# Patient Record
Sex: Male | Born: 1955 | Race: White | Hispanic: No | Marital: Married | Smoking: Former smoker
Health system: Southern US, Community
[De-identification: ages and names within clinical notes are randomized; demographics above are authoritative.]

## PROBLEM LIST (undated history)

## (undated) DIAGNOSIS — C801 Malignant (primary) neoplasm, unspecified: Secondary | ICD-10-CM

## (undated) DIAGNOSIS — F419 Anxiety disorder, unspecified: Secondary | ICD-10-CM

## (undated) DIAGNOSIS — M1990 Unspecified osteoarthritis, unspecified site: Secondary | ICD-10-CM

## (undated) HISTORY — PX: OTHER SURGICAL HISTORY: SHX169

## (undated) HISTORY — PX: MULTIPLE TOOTH EXTRACTIONS: SHX2053

---

## 2020-11-30 ENCOUNTER — Other Ambulatory Visit: Payer: Self-pay | Admitting: Orthopedic Surgery

## 2020-12-08 NOTE — Progress Notes (Signed)
DUE TO COVID-19 ONLY ONE VISITOR IS ALLOWED TO COME WITH YOU AND STAY IN THE WAITING ROOM ONLY DURING PRE OP AND PROCEDURE DAY OF SURGERY. THE 1 VISITOR  MAY VISIT WITH YOU AFTER SURGERY IN YOUR PRIVATE ROOM DURING VISITING HOURS ONLY!  YOU NEED TO HAVE A COVID 19 TEST ON__12/20/2021 _____ @_______ , THIS TEST MUST BE DONE BEFORE SURGERY,  COVID TESTING SITE [address] [place] [id number], IT IS ON THE RIGHT GOING OUT [address] APPROXIMATELY  2 MINUTES PAST [employer] ON THE RIGHT. ONCE YOUR COVID TEST IS COMPLETED,  PLEASE BEGIN THE QUARANTINE INSTRUCTIONS AS OUTLINED IN YOUR HANDOUT.                Raymond Huffman  12/08/2020   Your procedure is scheduled on: 12/17/2020    Report to [facility] Main  Entrance   Report to admitting at   0530am  AM     Call this number if you have problems the morning of surgery [phone]    REMEMBER: NO  SOLID FOOD CANDY OR GUM AFTER MIDNIGHT. CLEAR LIQUIDS UNTIL 0430am        . NOTHING BY MOUTH EXCEPT CLEAR LIQUIDS UNTIL    . PLEASE FINISH ENSURE DRINK PER SURGEON ORDER  WHICH NEEDS TO BE COMPLETED AT 0430am      .      CLEAR LIQUID DIET   Foods Allowed                                                                    Coffee and tea, regular and decaf                            Fruit ices (not with fruit pulp)                                      Iced Popsicles                                    Carbonated beverages, regular and diet                                    Cranberry, grape and apple juices Sports drinks like Gatorade Lightly seasoned clear broth or consume(fat free) Sugar, honey syrup ___________________________________________________________________      BRUSH YOUR TEETH MORNING OF SURGERY AND RINSE YOUR MOUTH OUT, NO CHEWING GUM CANDY OR MINTS.     Take these medicines the morning of surgery with A SIP OF WATER: Gabapentin, Inhalers as usual and bring   DO NOT TAKE ANY DIABETIC  MEDICATIONS DAY OF YOUR SURGERY                               You may not have any metal on your body including hair pins and              piercings  Do not wear jewelry, make-up, lotions, powders or perfumes, deodorant             Do not wear nail polish on your fingernails.  Do not shave  48 hours prior to surgery.              Men may shave face and neck.   Do not bring valuables to the hospital. [place].  Contacts, dentures or bridgework may not be worn into surgery.  Leave suitcase in the car. After surgery it may be brought to your room.     Patients discharged the day of surgery will not be allowed to drive home. IF YOU ARE HAVING SURGERY AND GOING HOME THE SAME DAY, YOU MUST HAVE AN ADULT TO DRIVE YOU HOME AND BE WITH YOU FOR 24 HOURS. YOU MAY GO HOME BY TAXI OR UBER OR ORTHERWISE, BUT AN ADULT MUST ACCOMPANY YOU HOME AND STAY WITH YOU FOR 24 HOURS.  Name and phone number of your driver:  Special Instructions: N/A              Please read over the following fact sheets you were given: _____________________________________________________________________  [facility] - Preparing for Surgery Before surgery, you can play an important role.  Because skin is not sterile, your skin needs to be as free of germs as possible.  You can reduce the number of germs on your skin by washing with CHG (chlorahexidine gluconate) soap before surgery.  CHG is an antiseptic cleaner which kills germs and bonds with the skin to continue killing germs even after washing. Please DO NOT use if you have an allergy to CHG or antibacterial soaps.  If your skin becomes reddened/irritated stop using the CHG and inform your nurse when you arrive at Short Stay. Do not shave (including legs and underarms) for at least 48 hours prior to the first CHG shower.  You may shave your face/neck. Please follow these instructions carefully:  1.  Shower with CHG Soap the night  before surgery and the  morning of Surgery.  2.  If you choose to wash your hair, wash your hair first as usual with your  normal  shampoo.  3.  After you shampoo, rinse your hair and body thoroughly to remove the  shampoo.                           4.  Use CHG as you would any other liquid soap.  You can apply chg directly  to the skin and wash                       Gently with a scrungie or clean washcloth.  5.  Apply the CHG Soap to your body ONLY FROM THE NECK DOWN.   Do not use on face/ open                           Wound or open sores. Avoid contact with eyes, ears mouth and genitals (private parts).                       Wash face,  Genitals (private parts) with your normal soap.             6.  Wash  thoroughly, paying special attention to the area where your surgery  will be performed.  7.  Thoroughly rinse your body with warm water from the neck down.  8.  DO NOT shower/wash with your normal soap after using and rinsing off  the CHG Soap.                9.  Pat yourself dry with a clean towel.            10.  Wear clean pajamas.            11.  Place clean sheets on your bed the night of your first shower and do not  sleep with pets. Day of Surgery : Do not apply any lotions/deodorants the morning of surgery.  Please wear clean clothes to the hospital/surgery center.  FAILURE TO FOLLOW THESE INSTRUCTIONS MAY RESULT IN THE CANCELLATION OF YOUR SURGERY PATIENT SIGNATURE_________________________________  NURSE SIGNATURE__________________________________  ________________________________________________________________________

## 2020-12-10 ENCOUNTER — Ambulatory Visit (HOSPITAL_COMMUNITY)
Admission: RE | Admit: 2020-12-10 | Discharge: 2020-12-10 | Disposition: A | Discharge status: Home / Self Care | Payer: PRIVATE HEALTH INSURANCE | Source: Ambulatory Visit | Attending: Orthopedic Surgery | Admitting: Orthopedic Surgery

## 2020-12-10 ENCOUNTER — Encounter (INDEPENDENT_AMBULATORY_CARE_PROVIDER_SITE_OTHER): Payer: Self-pay

## 2020-12-10 ENCOUNTER — Encounter (HOSPITAL_COMMUNITY)
Admission: RE | Admit: 2020-12-10 | Discharge: 2020-12-10 | Disposition: A | Discharge status: Home / Self Care | Payer: PRIVATE HEALTH INSURANCE | Source: Ambulatory Visit | Attending: Orthopedic Surgery | Admitting: Orthopedic Surgery

## 2020-12-10 ENCOUNTER — Other Ambulatory Visit: Payer: Self-pay

## 2020-12-10 ENCOUNTER — Encounter (HOSPITAL_COMMUNITY): Payer: Self-pay

## 2020-12-10 DIAGNOSIS — Z01811 Encounter for preprocedural respiratory examination: Secondary | ICD-10-CM

## 2020-12-10 HISTORY — DX: Unspecified osteoarthritis, unspecified site: M19.90

## 2020-12-10 HISTORY — DX: Malignant (primary) neoplasm, unspecified: C80.1

## 2020-12-10 HISTORY — DX: Anxiety disorder, unspecified: F41.9

## 2020-12-10 LAB — COMPREHENSIVE METABOLIC PANEL
ALT: 16 U/L (ref 0–44)
AST: 18 U/L (ref 15–41)
Albumin: 4.1 g/dL (ref 3.5–5.0)
Alkaline Phosphatase: 65 U/L (ref 38–126)
Anion gap: 10 (ref 5–15)
BUN: 19 mg/dL (ref 8–23)
CO2: 28 mmol/L (ref 22–32)
Calcium: 9.2 mg/dL (ref 8.9–10.3)
Chloride: 102 mmol/L (ref 98–111)
Creatinine, Ser: 0.78 mg/dL (ref 0.61–1.24)
GFR, Estimated: >60 mL/min (ref >60)
Glucose, Bld: 98 mg/dL (ref 70–99)
Potassium: 3.6 mmol/L (ref 3.5–5.1)
Sodium: 140 mmol/L (ref 135–145)
Total Bilirubin: 0.2 mg/dL — ABNORMAL LOW (ref 0.3–1.2)
Total Protein: 6.7 g/dL (ref 6.5–8.1)

## 2020-12-10 LAB — URINALYSIS, ROUTINE W REFLEX MICROSCOPIC
Bilirubin Urine: NEGATIVE
Glucose, UA: NEGATIVE mg/dL
Hgb urine dipstick: NEGATIVE
Ketones, ur: NEGATIVE mg/dL
Leukocytes,Ua: NEGATIVE
Nitrite: NEGATIVE
Protein, ur: NEGATIVE mg/dL
Specific Gravity, Urine: 1.029 (ref 1.005–1.030)
pH: 5 (ref 5.0–8.0)

## 2020-12-10 LAB — CBC WITH DIFFERENTIAL/PLATELET
Abs Immature Granulocytes: 0.01 10*3/uL (ref 0.00–0.07)
Basophils Absolute: 0 10*3/uL (ref 0.0–0.1)
Basophils Relative: 1 %
Eosinophils Absolute: 0.2 10*3/uL (ref 0.0–0.5)
Eosinophils Relative: 6 %
HCT: 33.6 % — ABNORMAL LOW (ref 39.0–52.0)
Hemoglobin: 10.7 g/dL — ABNORMAL LOW (ref 13.0–17.0)
Immature Granulocytes: 0 %
Lymphocytes Relative: 23 %
Lymphs Abs: 0.7 10*3/uL (ref 0.7–4.0)
MCH: 29.9 pg (ref 26.0–34.0)
MCHC: 31.8 g/dL (ref 30.0–36.0)
MCV: 93.9 fL (ref 80.0–100.0)
Monocytes Absolute: 0.6 10*3/uL (ref 0.1–1.0)
Monocytes Relative: 21 %
Neutro Abs: 1.6 10*3/uL — ABNORMAL LOW (ref 1.7–7.7)
Neutrophils Relative %: 49 %
Platelets: 200 10*3/uL (ref 150–400)
RBC: 3.58 MIL/uL — ABNORMAL LOW (ref 4.22–5.81)
RDW: 14.1 % (ref 11.5–15.5)
WBC: 3.1 10*3/uL — ABNORMAL LOW (ref 4.0–10.5)
nRBC: 0 % (ref 0.0–0.2)

## 2020-12-10 LAB — PROTIME-INR
INR: 1 (ref 0.8–1.2)
Prothrombin Time: 12.9 seconds (ref 11.4–15.2)

## 2020-12-10 LAB — APTT: aPTT: 35 seconds (ref 24–36)

## 2020-12-10 LAB — SURGICAL PCR SCREEN
MRSA, PCR: POSITIVE — AB
Staphylococcus aureus: POSITIVE — AB

## 2020-12-10 NOTE — Progress Notes (Signed)
[name]- Preparing for Total Shoulder Arthroplasty    Before surgery, you can play an important role. Because skin is not sterile, your skin needs to be as free of germs as possible. You can reduce the number of germs on your skin by using the following products. . Benzoyl Peroxide Gel o Reduces the number of germs present on the skin o Applied twice a day to shoulder area starting two days before surgery    ==================================================================  Please follow these instructions carefully:  BENZOYL PEROXIDE 5% GEL  Please do not use if you have an allergy to benzoyl peroxide.   If your skin becomes reddened/irritated stop using the benzoyl peroxide.  Starting two days before surgery, apply as follows: 1. Apply benzoyl peroxide in the morning and at night. Apply after taking a shower. If you are not taking a shower clean entire shoulder front, back, and side along with the armpit with a clean wet washcloth.  2. Place a quarter-sized dollop on your shoulder and rub in thoroughly, making sure to cover the front, back, and side of your shoulder, along with the armpit.   2 days before ____ AM   ____ PM              1 day before ____ AM   ____ PM                         3. Do this twice a day for two days.  (Last application is the night before surgery, AFTER using the CHG soap as described below).  4. Do NOT apply benzoyl peroxide gel on the day of surgery.

## 2020-12-10 NOTE — Progress Notes (Signed)
CBCdone 12/10/2020 routed to Dr Tamera Punt.

## 2020-12-11 ENCOUNTER — Other Ambulatory Visit: Payer: Self-pay | Admitting: Orthopedic Surgery

## 2020-12-14 ENCOUNTER — Other Ambulatory Visit (HOSPITAL_COMMUNITY)
Admission: RE | Admit: 2020-12-14 | Discharge: 2020-12-14 | Disposition: A | Discharge status: Home / Self Care | Payer: PRIVATE HEALTH INSURANCE | Source: Ambulatory Visit | Attending: Orthopedic Surgery | Admitting: Orthopedic Surgery

## 2020-12-14 DIAGNOSIS — Z20822 Contact with and (suspected) exposure to covid-19: Secondary | ICD-10-CM | POA: Insufficient documentation

## 2020-12-14 DIAGNOSIS — Z01812 Encounter for preprocedural laboratory examination: Secondary | ICD-10-CM | POA: Insufficient documentation

## 2020-12-15 LAB — SARS CORONAVIRUS 2 (TAT 6-24 HRS): SARS Coronavirus 2: NEGATIVE

## 2020-12-16 ENCOUNTER — Encounter (HOSPITAL_COMMUNITY): Payer: Self-pay | Admitting: Orthopedic Surgery

## 2020-12-16 MED ORDER — VANCOMYCIN HCL 1500 MG/300ML IV SOLN
1500.0000 mg | Freq: Once | INTRAVENOUS | Status: AC
  Administered 2020-12-17: 07:00:00 1500 mg via INTRAVENOUS
  Filled 2020-12-16: qty 300

## 2020-12-16 NOTE — Anesthesia Preprocedure Evaluation (Addendum)
Anesthesia Evaluation  Patient identified by MRN, date of birth, ID band Patient awake    Reviewed: Allergy & Precautions, NPO status , Patient's Chart, lab work & pertinent test results  Airway Mallampati: I  TM Distance: >3 FB Neck ROM: Full    Dental  (+) Teeth Intact, Dental Advisory Given, Missing, Caps, Loose,    Pulmonary neg pulmonary ROS, former smoker,    Pulmonary exam normal breath sounds clear to auscultation       Cardiovascular negative cardio ROS Normal cardiovascular exam Rhythm:Regular Rate:Normal  EKG- NSR, normal   Neuro/Psych Anxiety negative neurological ROS     GI/Hepatic Neg liver ROS, Hx/o tonsillar Ca   Endo/Other  negative endocrine ROS  Renal/GU negative Renal ROS  negative genitourinary   Musculoskeletal  (+) Arthritis , Osteoarthritis,  Right shoulder rotator cuff tear   Abdominal   Peds  Hematology  (+) anemia ,   Anesthesia Other Findings   Reproductive/Obstetrics                           Anesthesia Physical Anesthesia Plan  ASA: II  Anesthesia Plan: General   Post-op Pain Management:  Regional for Post-op pain   Induction: Intravenous  PONV Risk Score and Plan: 3 and Midazolam, Ondansetron and Treatment may vary due to age or medical condition  Airway Management Planned: Oral ETT  Additional Equipment:   Intra-op Plan:   Post-operative Plan: Extubation in OR  Informed Consent: I have reviewed the patients History and Physical, chart, labs and discussed the procedure including the risks, benefits and alternatives for the proposed anesthesia with the patient or authorized representative who has indicated his/her understanding and acceptance.     Dental advisory given  Plan Discussed with: CRNA and Anesthesiologist  Anesthesia Plan Comments:        Anesthesia Quick Evaluation

## 2020-12-17 ENCOUNTER — Other Ambulatory Visit: Payer: Self-pay

## 2020-12-17 ENCOUNTER — Observation Stay (HOSPITAL_COMMUNITY)
Admission: RE | Admit: 2020-12-17 | Discharge: 2020-12-18 | Disposition: A | Discharge status: Home / Self Care | Payer: Worker's Compensation | Source: Ambulatory Visit | Attending: Orthopedic Surgery | Admitting: Orthopedic Surgery

## 2020-12-17 ENCOUNTER — Observation Stay (HOSPITAL_COMMUNITY): Payer: Worker's Compensation

## 2020-12-17 ENCOUNTER — Ambulatory Visit (HOSPITAL_COMMUNITY): Payer: Worker's Compensation | Admitting: Anesthesiology

## 2020-12-17 ENCOUNTER — Encounter (HOSPITAL_COMMUNITY): Payer: Self-pay | Admitting: Orthopedic Surgery

## 2020-12-17 ENCOUNTER — Encounter (HOSPITAL_COMMUNITY)
Admission: RE | Disposition: A | Discharge status: Home / Self Care | Payer: Self-pay | Source: Ambulatory Visit | Attending: Orthopedic Surgery

## 2020-12-17 DIAGNOSIS — M75101 Unspecified rotator cuff tear or rupture of right shoulder, not specified as traumatic: Secondary | ICD-10-CM | POA: Diagnosis not present

## 2020-12-17 DIAGNOSIS — Z87891 Personal history of nicotine dependence: Secondary | ICD-10-CM | POA: Insufficient documentation

## 2020-12-17 DIAGNOSIS — Z96611 Presence of right artificial shoulder joint: Secondary | ICD-10-CM

## 2020-12-17 DIAGNOSIS — M25511 Pain in right shoulder: Secondary | ICD-10-CM | POA: Diagnosis present

## 2020-12-17 DIAGNOSIS — Z85818 Personal history of malignant neoplasm of other sites of lip, oral cavity, and pharynx: Secondary | ICD-10-CM | POA: Insufficient documentation

## 2020-12-17 DIAGNOSIS — G8929 Other chronic pain: Secondary | ICD-10-CM | POA: Diagnosis not present

## 2020-12-17 HISTORY — PX: REVERSE SHOULDER ARTHROPLASTY: SHX5054

## 2020-12-17 LAB — TYPE AND SCREEN
ABO/RH(D): O POS
Antibody Screen: NEGATIVE

## 2020-12-17 LAB — ABO/RH: ABO/RH(D): O POS

## 2020-12-17 SURGERY — ARTHROPLASTY, SHOULDER, TOTAL, REVERSE
Anesthesia: General | Site: Shoulder | Laterality: Right

## 2020-12-17 MED ORDER — SUGAMMADEX SODIUM 500 MG/5ML IV SOLN
INTRAVENOUS | Status: AC
  Filled 2020-12-17: qty 5

## 2020-12-17 MED ORDER — PROPOFOL 10 MG/ML IV BOLUS
INTRAVENOUS | Status: DC | PRN
  Administered 2020-12-17: 200 mg via INTRAVENOUS

## 2020-12-17 MED ORDER — OXYCODONE HCL 5 MG PO TABS
ORAL_TABLET | ORAL | Status: AC
  Filled 2020-12-17: qty 2

## 2020-12-17 MED ORDER — METOCLOPRAMIDE HCL 5 MG/ML IJ SOLN: 5.0000 mg | Freq: Three times a day (TID) | INTRAMUSCULAR | Status: DC | PRN

## 2020-12-17 MED ORDER — POLYETHYLENE GLYCOL 3350 17 G PO PACK: 17.0000 g | PACK | Freq: Every day | ORAL | Status: DC | PRN

## 2020-12-17 MED ORDER — TRANEXAMIC ACID-NACL 1000-0.7 MG/100ML-% IV SOLN
1000.0000 mg | INTRAVENOUS | Status: AC
  Administered 2020-12-17: 08:00:00 1000 mg via INTRAVENOUS
  Filled 2020-12-17: qty 100

## 2020-12-17 MED ORDER — ONDANSETRON HCL 4 MG/2ML IJ SOLN: 4.0000 mg | Freq: Once | INTRAMUSCULAR | Status: DC | PRN

## 2020-12-17 MED ORDER — OXYCODONE HCL 5 MG PO TABS: 5.0000 mg | ORAL_TABLET | ORAL | Status: DC | PRN

## 2020-12-17 MED ORDER — SODIUM CHLORIDE 0.9 % IV SOLN: INTRAVENOUS | Status: DC

## 2020-12-17 MED ORDER — HYDROMORPHONE HCL 1 MG/ML IJ SOLN: 0.5000 mg | INTRAMUSCULAR | Status: DC | PRN

## 2020-12-17 MED ORDER — MIDAZOLAM HCL 2 MG/2ML IJ SOLN
INTRAMUSCULAR | Status: AC
  Filled 2020-12-17: qty 2

## 2020-12-17 MED ORDER — CEFAZOLIN SODIUM-DEXTROSE 2-4 GM/100ML-% IV SOLN
2.0000 g | Freq: Once | INTRAVENOUS | Status: AC
  Administered 2020-12-17: 08:00:00 2 g via INTRAVENOUS
  Filled 2020-12-17: qty 100

## 2020-12-17 MED ORDER — SODIUM CHLORIDE 0.9 % IR SOLN
Status: DC | PRN
  Administered 2020-12-17: 1000 mL

## 2020-12-17 MED ORDER — CEFAZOLIN SODIUM-DEXTROSE 2-4 GM/100ML-% IV SOLN: 2.0000 g | INTRAVENOUS | Status: DC

## 2020-12-17 MED ORDER — ASPIRIN EC 81 MG PO TBEC
81.0000 mg | DELAYED_RELEASE_TABLET | Freq: Two times a day (BID) | ORAL | Status: DC
  Administered 2020-12-17 – 2020-12-18 (×2): 81 mg via ORAL
  Filled 2020-12-17 (×2): qty 1

## 2020-12-17 MED ORDER — ACETAMINOPHEN 500 MG PO TABS
1000.0000 mg | ORAL_TABLET | Freq: Four times a day (QID) | ORAL | Status: AC
  Administered 2020-12-17 – 2020-12-18 (×4): 1000 mg via ORAL
  Filled 2020-12-17 (×4): qty 2

## 2020-12-17 MED ORDER — MENTHOL 3 MG MT LOZG: 1.0000 | LOZENGE | OROMUCOSAL | Status: DC | PRN

## 2020-12-17 MED ORDER — FENTANYL CITRATE (PF) 100 MCG/2ML IJ SOLN
INTRAMUSCULAR | Status: AC
  Administered 2020-12-17: 09:00:00 50 ug via INTRAVENOUS
  Filled 2020-12-17: qty 2

## 2020-12-17 MED ORDER — WATER FOR IRRIGATION, STERILE IR SOLN
Status: DC | PRN
  Administered 2020-12-17: 2000 mL

## 2020-12-17 MED ORDER — LACTATED RINGERS IV SOLN: INTRAVENOUS | Status: DC | PRN

## 2020-12-17 MED ORDER — FENTANYL CITRATE (PF) 250 MCG/5ML IJ SOLN
INTRAMUSCULAR | Status: DC | PRN
  Administered 2020-12-17 (×2): 50 ug via INTRAVENOUS

## 2020-12-17 MED ORDER — PHENYLEPHRINE HCL-NACL 10-0.9 MG/250ML-% IV SOLN
INTRAVENOUS | Status: AC
  Filled 2020-12-17: qty 250

## 2020-12-17 MED ORDER — DIPHENHYDRAMINE HCL 12.5 MG/5ML PO ELIX
12.5000 mg | ORAL_SOLUTION | ORAL | Status: DC | PRN
  Administered 2020-12-17: 25 mg via ORAL
  Filled 2020-12-17: qty 10

## 2020-12-17 MED ORDER — METHOCARBAMOL 500 MG IVPB - SIMPLE MED
INTRAVENOUS | Status: AC
  Filled 2020-12-17: qty 50

## 2020-12-17 MED ORDER — PHENYLEPHRINE HCL-NACL 10-0.9 MG/250ML-% IV SOLN
INTRAVENOUS | Status: DC | PRN
  Administered 2020-12-17: 25 ug/min via INTRAVENOUS

## 2020-12-17 MED ORDER — FENTANYL CITRATE (PF) 100 MCG/2ML IJ SOLN
25.0000 ug | INTRAMUSCULAR | Status: DC | PRN
Start: 2020-12-17 — End: 2020-12-17
  Administered 2020-12-17: 50 ug via INTRAVENOUS

## 2020-12-17 MED ORDER — ONDANSETRON HCL 4 MG/2ML IJ SOLN: 4.0000 mg | Freq: Four times a day (QID) | INTRAMUSCULAR | Status: DC | PRN

## 2020-12-17 MED ORDER — LIDOCAINE HCL (PF) 2 % IJ SOLN
INTRAMUSCULAR | Status: AC
  Filled 2020-12-17: qty 5

## 2020-12-17 MED ORDER — DOCUSATE SODIUM 100 MG PO CAPS
100.0000 mg | ORAL_CAPSULE | Freq: Two times a day (BID) | ORAL | Status: DC
  Administered 2020-12-17 – 2020-12-18 (×2): 100 mg via ORAL
  Filled 2020-12-17 (×2): qty 1

## 2020-12-17 MED ORDER — OXYCODONE HCL 5 MG PO TABS
10.0000 mg | ORAL_TABLET | ORAL | Status: DC | PRN
  Administered 2020-12-17: 10 mg via ORAL
  Administered 2020-12-17 – 2020-12-18 (×4): 15 mg via ORAL
  Filled 2020-12-17 (×4): qty 3

## 2020-12-17 MED ORDER — ONDANSETRON HCL 4 MG/2ML IJ SOLN
INTRAMUSCULAR | Status: AC
  Filled 2020-12-17: qty 2

## 2020-12-17 MED ORDER — SUGAMMADEX SODIUM 500 MG/5ML IV SOLN
INTRAVENOUS | Status: DC | PRN
  Administered 2020-12-17: 400 mg via INTRAVENOUS

## 2020-12-17 MED ORDER — BUPIVACAINE-EPINEPHRINE (PF) 0.5% -1:200000 IJ SOLN
INTRAMUSCULAR | Status: DC | PRN
  Administered 2020-12-17: 20 mL via PERINEURAL

## 2020-12-17 MED ORDER — PHENOL 1.4 % MT LIQD: 1.0000 | OROMUCOSAL | Status: DC | PRN

## 2020-12-17 MED ORDER — ALUM & MAG HYDROXIDE-SIMETH 200-200-20 MG/5ML PO SUSP: 30.0000 mL | ORAL | Status: DC | PRN

## 2020-12-17 MED ORDER — ONDANSETRON HCL 4 MG PO TABS: 4.0000 mg | ORAL_TABLET | Freq: Four times a day (QID) | ORAL | Status: DC | PRN

## 2020-12-17 MED ORDER — GABAPENTIN 300 MG PO CAPS
300.0000 mg | ORAL_CAPSULE | Freq: Four times a day (QID) | ORAL | Status: DC
  Administered 2020-12-17 – 2020-12-18 (×3): 300 mg via ORAL
  Filled 2020-12-17 (×4): qty 1

## 2020-12-17 MED ORDER — ROCURONIUM BROMIDE 10 MG/ML (PF) SYRINGE
PREFILLED_SYRINGE | INTRAVENOUS | Status: DC | PRN
  Administered 2020-12-17: 30 mg via INTRAVENOUS
  Administered 2020-12-17: 10 mg via INTRAVENOUS
  Administered 2020-12-17: 60 mg via INTRAVENOUS

## 2020-12-17 MED ORDER — ONDANSETRON HCL 4 MG/2ML IJ SOLN
INTRAMUSCULAR | Status: DC | PRN
  Administered 2020-12-17: 4 mg via INTRAVENOUS

## 2020-12-17 MED ORDER — LIDOCAINE 2% (20 MG/ML) 5 ML SYRINGE
INTRAMUSCULAR | Status: DC | PRN
  Administered 2020-12-17: 60 mg via INTRAVENOUS

## 2020-12-17 MED ORDER — PROPOFOL 10 MG/ML IV BOLUS
INTRAVENOUS | Status: AC
  Filled 2020-12-17: qty 20

## 2020-12-17 MED ORDER — ROCURONIUM BROMIDE 10 MG/ML (PF) SYRINGE
PREFILLED_SYRINGE | INTRAVENOUS | Status: AC
  Filled 2020-12-17: qty 10

## 2020-12-17 MED ORDER — ZOLPIDEM TARTRATE 5 MG PO TABS: 5.0000 mg | ORAL_TABLET | Freq: Every evening | ORAL | Status: DC | PRN

## 2020-12-17 MED ORDER — METHOCARBAMOL 500 MG PO TABS
500.0000 mg | ORAL_TABLET | Freq: Four times a day (QID) | ORAL | Status: DC | PRN
  Administered 2020-12-17 – 2020-12-18 (×2): 500 mg via ORAL
  Filled 2020-12-17 (×2): qty 1

## 2020-12-17 MED ORDER — MIDAZOLAM HCL 5 MG/5ML IJ SOLN
INTRAMUSCULAR | Status: DC | PRN
  Administered 2020-12-17 (×2): 1 mg via INTRAVENOUS

## 2020-12-17 MED ORDER — METOCLOPRAMIDE HCL 5 MG PO TABS: 5.0000 mg | ORAL_TABLET | Freq: Three times a day (TID) | ORAL | Status: DC | PRN

## 2020-12-17 MED ORDER — BUPIVACAINE LIPOSOME 1.3 % IJ SUSP
INTRAMUSCULAR | Status: DC | PRN
  Administered 2020-12-17: 10 mL via PERINEURAL

## 2020-12-17 MED ORDER — METHOCARBAMOL 500 MG IVPB - SIMPLE MED
500.0000 mg | Freq: Four times a day (QID) | INTRAVENOUS | Status: DC | PRN
  Administered 2020-12-17: 10:00:00 500 mg via INTRAVENOUS
  Filled 2020-12-17: qty 50

## 2020-12-17 MED ORDER — CEFAZOLIN SODIUM-DEXTROSE 2-4 GM/100ML-% IV SOLN
2.0000 g | Freq: Four times a day (QID) | INTRAVENOUS | Status: AC
  Administered 2020-12-17 – 2020-12-18 (×3): 2 g via INTRAVENOUS
  Filled 2020-12-17 (×3): qty 100

## 2020-12-17 MED ORDER — 0.9 % SODIUM CHLORIDE (POUR BTL) OPTIME
TOPICAL | Status: DC | PRN
  Administered 2020-12-17: 08:00:00 1000 mL

## 2020-12-17 MED ORDER — FLEET ENEMA 7-19 GM/118ML RE ENEM: 1.0000 | ENEMA | Freq: Once | RECTAL | Status: DC | PRN

## 2020-12-17 MED ORDER — FENTANYL CITRATE (PF) 100 MCG/2ML IJ SOLN
INTRAMUSCULAR | Status: AC
  Filled 2020-12-17: qty 2

## 2020-12-17 MED ORDER — DEXAMETHASONE SODIUM PHOSPHATE 10 MG/ML IJ SOLN
INTRAMUSCULAR | Status: AC
  Filled 2020-12-17: qty 1

## 2020-12-17 MED ORDER — BISACODYL 5 MG PO TBEC: 5.0000 mg | DELAYED_RELEASE_TABLET | Freq: Every day | ORAL | Status: DC | PRN

## 2020-12-17 SURGICAL SUPPLY — 79 items
BAG ZIPLOCK 12X15 (MISCELLANEOUS) ×3 IMPLANT
BASEPLATE P2 COATD GLND 6.5X30 (Shoulder) ×1 IMPLANT
BIT DRILL 1.6MX128 (BIT) IMPLANT
BIT DRILL 1.6MX128MM (BIT)
BIT DRILL 2.5 DIA 127 CALI (BIT) ×3 IMPLANT
BIT DRILL 4 DIA CALIBRATED (BIT) ×3 IMPLANT
BLADE SAW SAG 73X25 THK (BLADE) ×2
BLADE SAW SGTL 73X25 THK (BLADE) ×1 IMPLANT
BOOTIES KNEE HIGH SLOAN (MISCELLANEOUS) ×6 IMPLANT
CLOSURE WOUND 1/2 X4 (GAUZE/BANDAGES/DRESSINGS) ×1
COOLER ICEMAN CLASSIC (MISCELLANEOUS) IMPLANT
COVER BACK TABLE 60X90IN (DRAPES) ×3 IMPLANT
COVER SURGICAL LIGHT HANDLE (MISCELLANEOUS) ×3 IMPLANT
COVER WAND RF STERILE (DRAPES) IMPLANT
DRAPE INCISE IOBAN 66X45 STRL (DRAPES) ×3 IMPLANT
DRAPE ORTHO SPLIT 77X108 STRL (DRAPES) ×4
DRAPE POUCH INSTRU U-SHP 10X18 (DRAPES) ×3 IMPLANT
DRAPE SHEET LG 3/4 BI-LAMINATE (DRAPES) ×3 IMPLANT
DRAPE SURG 17X11 SM STRL (DRAPES) ×3 IMPLANT
DRAPE SURG ORHT 6 SPLT 77X108 (DRAPES) ×2 IMPLANT
DRAPE TOP 10253 STERILE (DRAPES) ×3 IMPLANT
DRAPE U-SHAPE 47X51 STRL (DRAPES) ×3 IMPLANT
DRESSING AQUACEL AG SP 3.5X10 (GAUZE/BANDAGES/DRESSINGS) ×1 IMPLANT
DRSG AQUACEL AG ADV 3.5X 6 (GAUZE/BANDAGES/DRESSINGS) ×3 IMPLANT
DRSG AQUACEL AG SP 3.5X10 (GAUZE/BANDAGES/DRESSINGS) ×3
DURAPREP 26ML APPLICATOR (WOUND CARE) ×6 IMPLANT
ELECT BLADE TIP CTD 4 INCH (ELECTRODE) ×3 IMPLANT
ELECT REM PT RETURN 15FT ADLT (MISCELLANEOUS) ×3 IMPLANT
GLOVE BIO SURGEON STRL SZ7.5 (GLOVE) ×3 IMPLANT
GLOVE BIOGEL PI IND STRL 7.0 (GLOVE) ×1 IMPLANT
GLOVE BIOGEL PI IND STRL 8 (GLOVE) ×1 IMPLANT
GLOVE BIOGEL PI INDICATOR 7.0 (GLOVE) ×2
GLOVE BIOGEL PI INDICATOR 8 (GLOVE) ×2
GLOVE SURG ENC MOIS LTX SZ7 (GLOVE) ×3 IMPLANT
GOWN STRL REUS W/TWL LRG LVL3 (GOWN DISPOSABLE) ×3 IMPLANT
GOWN STRL REUS W/TWL XL LVL3 (GOWN DISPOSABLE) ×3 IMPLANT
HANDPIECE INTERPULSE COAX TIP (DISPOSABLE) ×2
HOOD PEEL AWAY FLYTE STAYCOOL (MISCELLANEOUS) ×9 IMPLANT
INSERT EPOLY STND HUMERUS 32MM (Shoulder) ×3 IMPLANT
INSERT EPOLYSTD HUMERUS 32MM (Shoulder) ×1 IMPLANT
KIT BASIN OR (CUSTOM PROCEDURE TRAY) ×3 IMPLANT
KIT TURNOVER KIT A (KITS) ×3 IMPLANT
MANIFOLD NEPTUNE II (INSTRUMENTS) ×3 IMPLANT
NEEDLE TROCAR POINT SZ 2 1/2 (NEEDLE) IMPLANT
NS IRRIG 1000ML POUR BTL (IV SOLUTION) ×3 IMPLANT
P2 COATDE GLNOID BSEPLT 6.5X30 (Shoulder) ×3 IMPLANT
PACK SHOULDER (CUSTOM PROCEDURE TRAY) ×3 IMPLANT
PAD COLD SHLDR WRAP-ON (PAD) IMPLANT
PROTECTOR NERVE ULNAR (MISCELLANEOUS) IMPLANT
RESTRAINT HEAD UNIVERSAL NS (MISCELLANEOUS) IMPLANT
RETRIEVER SUT HEWSON (MISCELLANEOUS) IMPLANT
SCREW BONE LOCKING RSP 5.0X14 (Screw) ×3 IMPLANT
SCREW BONE LOCKING RSP 5.0X30 (Screw) ×6 IMPLANT
SCREW BONE RSP LOCK 5X14 (Screw) ×1 IMPLANT
SCREW BONE RSP LOCK 5X18 (Screw) ×1 IMPLANT
SCREW BONE RSP LOCK 5X30 (Screw) ×2 IMPLANT
SCREW BONE RSP LOCKING 18MM LG (Screw) ×3 IMPLANT
SCREW RETAIN W/HEAD 32MM (Shoulder) ×3 IMPLANT
SET HNDPC FAN SPRY TIP SCT (DISPOSABLE) ×1 IMPLANT
SLING ARM FOAM STRAP LRG (SOFTGOODS) ×3 IMPLANT
SLING ARM IMMOBILIZER LRG (SOFTGOODS) ×3 IMPLANT
SLING ARM IMMOBILIZER MED (SOFTGOODS) IMPLANT
SPONGE LAP 18X18 RF (DISPOSABLE) ×3 IMPLANT
STEM HUMERAL 12X48 STD SHORT (Shoulder) ×3 IMPLANT
STRIP CLOSURE SKIN 1/2X4 (GAUZE/BANDAGES/DRESSINGS) ×2 IMPLANT
SUCTION FRAZIER HANDLE 10FR (MISCELLANEOUS)
SUCTION TUBE FRAZIER 10FR DISP (MISCELLANEOUS) IMPLANT
SUPPORT WRAP ARM LG (MISCELLANEOUS) IMPLANT
SUT ETHIBOND 2 V 37 (SUTURE) ×3 IMPLANT
SUT FIBERWIRE #2 38 REV NDL BL (SUTURE)
SUT MNCRL AB 4-0 PS2 18 (SUTURE) ×3 IMPLANT
SUT VIC AB 2-0 CT1 27 (SUTURE) ×2
SUT VIC AB 2-0 CT1 TAPERPNT 27 (SUTURE) ×1 IMPLANT
SUTURE FIBERWR#2 38 REV NDL BL (SUTURE) IMPLANT
TAPE LABRALWHITE 1.5X36 (TAPE) IMPLANT
TAPE SUT LABRALTAP WHT/BLK (SUTURE) IMPLANT
TOWEL OR 17X26 10 PK STRL BLUE (TOWEL DISPOSABLE) ×3 IMPLANT
TOWEL OR NON WOVEN STRL DISP B (DISPOSABLE) ×3 IMPLANT
WATER STERILE IRR 1000ML POUR (IV SOLUTION) ×3 IMPLANT

## 2020-12-17 NOTE — Anesthesia Procedure Notes (Signed)
Anesthesia Regional Block: Interscalene brachial plexus block   Pre-Anesthetic Checklist: ,, timeout performed, Correct Patient, Correct Site, Correct Laterality, Correct Procedure, Correct Position, site marked, Risks and benefits discussed,  Surgical consent,  Pre-op evaluation,  At surgeon's request and post-op pain management  Laterality: Right  Prep: chloraprep       Needles:  Injection technique: Single-shot  Needle Type: Echogenic Stimulator Needle     Needle Length: 9cm  Needle Gauge: 21   Needle insertion depth: 6 cm   Additional Needles:   Procedures:,,,, ultrasound used (permanent image in chart),,,,  Motor weakness within 7 minutes.  Narrative:  Start time: 12/17/2020 6:50 AM End time: 12/17/2020 6:55 AM Injection made incrementally with aspirations every 5 mL.  Performed by: Personally   Additional Notes: Timeout performed. Patient sedated. Relevant anatomy ID'd using [place]. Incremental 2-36ml injection of LA with frequent aspiration. Patient tolerated procedure well.        Right Interscalene Block

## 2020-12-17 NOTE — Discharge Instructions (Signed)
Dental Antibiotics:  In most cases prophylactic antibiotics for Dental procdeures after total joint surgery are not necessary.  Exceptions are as follows:  1. History of prior total joint infection  2. Severely immunocompromised (Organ Transplant, cancer chemotherapy, Rheumatoid biologic meds such as Humera)  3. Poorly controlled diabetes (A1C &gt; 8.0, blood glucose over 200)  If you have one of these conditions, contact your surgeon for an antibiotic prescription, prior to your dental procedure.  Discharge Instructions after Reverse Total Shoulder Arthroplasty   . A sling has been provided for you. You are to wear this at all times (except for bathing and dressing), until your first post operative visit with Dr. Chandler. Please also wear while sleeping at night. While you bath and dress, let the arm/elbow extend straight down to stretch your elbow. Wiggle your fingers and pump your first while your in the sling to prevent hand swelling. . Use ice on the shoulder intermittently over the first 48 hours after surgery. Continue to use ice or and ice machine as needed after 48 hours for pain control/swelling.  . Pain medicine has been prescribed for you.  . Use your medicine liberally over the first 48 hours, and then you can begin to taper your use. You may take Extra Strength Tylenol or Tylenol only in place of the pain pills. DO NOT take ANY nonsteroidal anti-inflammatory pain medications: Advil, Motrin, Ibuprofen, Aleve, Naproxen or Naprosyn.  . Take one aspirin a day for 2 weeks after surgery, unless you have an aspirin sensitivity/allergy or asthma.  . Leave your dressing on until your first follow up visit.  You may shower with the dressing.  Hold your arm as if you still have your sling on while you shower. . Simply allow the water to wash over the site and then pat dry. Make sure your axilla (armpit) is completely dry after showering.    Please call [phone] during normal  business hours or [phone] after hours for any problems. Including the following:  - excessive redness of the incisions - drainage for more than 4 days - fever of more than 101.5 F  *Please note that pain medications will not be refilled after hours or on weekends.      

## 2020-12-17 NOTE — Op Note (Signed)
Procedure(s): REVERSE SHOULDER ARTHROPLASTY Procedure Note  Curren Mohrmann male 64 y.o. 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 male  With recurrent right shoulder rotator cuff tear.  Pain and dysfunction interfered with quality of life and nonoperative treatment with activity modification, NSAIDS and injections failed.     Surgeon: Isabella Stalling   Assistants: Jeanmarie Hubert PA-C [facility] was present and scrubbed throughout the procedure and was essential in positioning, retraction, exposure, and closure)    Procedure Detail  REVERSE SHOULDER ARTHROPLASTY   Estimated Blood Loss:  200 mL         Drains: none  Blood Given: none          Specimens: none        Complications:  * No complications entered in OR log *         Disposition: PACU - hemodynamically stable.         Condition: stable      OPERATIVE FINDINGS:  A DJO Altivate pressfit reverse total shoulder arthroplasty was placed with a  size 12 stem, a 32 standard glenosphere, and a standard-mm poly insert. The base plate  fixation was excellent.  PROCEDURE: The patient was identified in the preoperative holding area  where I personally marked the operative site after verifying site, side,  and procedure with the patient. An interscalene block given by  the attending anesthesiologist in the holding area and the patient was taken back to the operating room where all extremities were  carefully padded in position after general anesthesia was induced. She  was placed in a beach-chair position and the operative upper extremity was  prepped and draped in a standard sterile fashion. An approximately 10-  cm incision was made from the tip of the coracoid process to the center  point of the  humerus at the level of the axilla. Dissection was carried  down through subcutaneous tissues to the level of the cephalic vein  which was taken laterally with the deltoid. The pectoralis major was  retracted medially. The subdeltoid space was developed and the lateral  edge of the conjoined tendon was identified. The undersurface of  conjoined tendon was palpated and the musculocutaneous nerve was not in  the field. Retractor was placed underneath the conjoined and second  retractor was placed lateral into the deltoid. The circumflex humeral  artery and vessels were identified and clamped and coagulated. The  biceps tendon was absent.  The subscapularis was taken down as a peel with the underlying capsule.  The  joint was then gently externally rotated while the capsule was released  from the humeral neck around to just beyond the 6 o'clock position. At  this point, the joint was dislocated and the humeral head was presented  into the wound. The excessive osteophyte formation was removed with a  large rongeur.  The cutting guide was used to make the appropriate  head cut and the head was saved for potentially bone grafting.  The glenoid was exposed with the arm in an  abducted extended position. The anterior and posterior labrum were  completely excised and the capsule was released circumferentially to  allow for exposure of the glenoid for preparation. The 2.5 mm drill was  placed using the guide in 5-10 inferior angulation and the tap was then advanced in the  same hole. Small and large reamers were then used. The tap was then removed and the Metaglene was then screwed in with excellent purchase.  The peripheral guide was then used to drilled measured and filled peripheral locking screws. The size 32 standard glenosphere was then impacted on the [facility] taper and the central screw was placed. The humerus was then again exposed and the diaphyseal reamers were used followed by the metaphyseal  reamers. The final broach was left in place in the proximal trial was placed. The joint was reduced and with this implant it was felt that soft tissue tensioning was appropriate with excellent stability and excellent range of motion. Therefore, final humeral stem was placed press-fit.  And then the trial polyethylene inserts were tested again and the above implant was felt to be the most appropriate for final insertion. The joint was reduced taken through full range of motion and felt to be stable. Soft tissue tension was appropriate.  The joint was then copiously irrigated with pulse  lavage and the wound was then closed. The subscapularis was repaired with Ethibond suture.  Skin was closed with 2-0 Vicryl in a deep dermal layer and 4-0  Monocryl for skin closure. Steri-Strips were applied. Sterile  dressings were then applied as well as a sling. The patient was allowed  to awaken from general anesthesia, transferred to stretcher, and taken  to recovery room in stable condition.   POSTOPERATIVE PLAN: The patient will be kept in the hospital postoperatively  for pain control and observation.

## 2020-12-17 NOTE — Transfer of Care (Signed)
Immediate Anesthesia Transfer of Care Note  Patient: Raymond Huffman  Procedure(s) Performed: REVERSE SHOULDER ARTHROPLASTY (Right Shoulder)  Patient Location: PACU  Anesthesia Type:GA combined with regional for post-op pain  Level of Consciousness: awake, alert  and oriented  Airway & Oxygen Therapy: Patient Spontanous Breathing and Patient connected to face mask oxygen  Post-op Assessment: Report given to RN and Post -op Vital signs reviewed and stable  Post vital signs: Reviewed and stable  Last Vitals:  Vitals Value Taken Time  BP 164/88 12/17/20 0900  Temp    Pulse 73 12/17/20 0901  Resp 18 12/17/20 0901  SpO2 99 % 12/17/20 0901  Vitals shown include unvalidated device data.  Last Pain:  Vitals:   12/17/20 0643  TempSrc: Oral  PainSc:          Complications: No complications documented.

## 2020-12-17 NOTE — Anesthesia Procedure Notes (Signed)
Procedure Name: Intubation Date/Time: 12/17/2020 7:34 AM Performed by: Lollie Sails, CRNA Pre-anesthesia Checklist: Patient identified, Emergency Drugs available, Suction available, Patient being monitored and Timeout performed Patient Re-evaluated:Patient Re-evaluated prior to induction Oxygen Delivery Method: Circle system utilized Preoxygenation: Pre-oxygenation with 100% oxygen Induction Type: IV induction Ventilation: Mask ventilation without difficulty and Oral airway inserted - appropriate to patient size Laryngoscope Size: Sabra Heck and 3 Grade View: Grade I Tube type: Oral Tube size: 7.5 mm Number of attempts: 1 Airway Equipment and Method: Stylet Placement Confirmation: ETT inserted through vocal cords under direct vision,  positive ETCO2 and breath sounds checked- equal and bilateral Secured at: 23 cm Tube secured with: Tape Dental Injury: Teeth and Oropharynx as per pre-operative assessment

## 2020-12-17 NOTE — Anesthesia Postprocedure Evaluation (Signed)
Anesthesia Post Note  Patient: Raymond Huffman  Procedure(s) Performed: REVERSE SHOULDER ARTHROPLASTY (Right Shoulder)     Patient location during evaluation: PACU Anesthesia Type: General Level of consciousness: awake and alert and oriented Pain management: pain level controlled Vital Signs Assessment: post-procedure vital signs reviewed and stable Respiratory status: spontaneous breathing, nonlabored ventilation and respiratory function stable Cardiovascular status: blood pressure returned to baseline and stable Postop Assessment: no apparent nausea or vomiting Anesthetic complications: no   No complications documented.  Last Vitals:  Vitals:   12/17/20 1000 12/17/20 1015  BP: (!) 141/76 127/70  Pulse: 67 71  Resp: 12 14  Temp:    SpO2: 94% 96%    Last Pain:  Vitals:   12/17/20 1000  TempSrc:   PainSc: 6                  Liliani Bobo A.

## 2020-12-17 NOTE — H&P (Signed)
Raymond Huffman is an 64 y.o. male.   Chief Complaint: R shoulder pain and dysfunction HPI: R shoulder pain and weakness s/p failed rotator cuff repair.  Past Medical History:  Diagnosis Date  . Anxiety   . Arthritis   . Cancer [facility])    tonsil cancer     Past Surgical History:  Procedure Laterality Date  . carpal tunnel on right     . MULTIPLE TOOTH EXTRACTIONS    . right shoulder surgery       History reviewed. No pertinent family history. Social History:  reports that he has quit smoking. His smoking use included cigarettes. He has never used smokeless tobacco. He reports that he does not drink alcohol and does not use drugs.  Allergies: No Known Allergies  Medications Prior to Admission  Medication Sig Dispense Refill  . acetaminophen (TYLENOL) 325 MG tablet Take 975 mg by mouth every 6 (six) hours as needed for moderate pain.    Marland Kitchen acetaminophen (TYLENOL) 650 MG CR tablet Take 1,950 mg by mouth at bedtime as needed for pain.    Marland Kitchen albuterol (VENTOLIN HFA) 108 (90 Base) MCG/ACT inhaler Inhale 1-2 puffs into the lungs every 6 (six) hours as needed for wheezing or shortness of breath.    . gabapentin (NEURONTIN) 300 MG capsule Take 300 mg by mouth 4 (four) times daily.    Marland Kitchen oxyCODONE-acetaminophen (PERCOCET) 10-325 MG tablet Take 1 tablet by mouth every 6 (six) hours as needed for pain.      Results for orders placed or performed during the hospital encounter of 12/17/20 (from the past 48 hour(s))  ABO/Rh     Status: None (Preliminary result)   Collection Time: 12/17/20  6:35 AM  Result Value Ref Range   ABO/RH(D) PENDING    No results found.  Review of Systems  All other systems reviewed and are negative.   Blood pressure (!) 149/88, pulse 75, temperature 98.2 F (36.8 C), temperature source Oral, resp. rate 18, weight 103.9 kg, SpO2 97 %. Physical Exam Constitutional:      Appearance: Normal appearance.  HENT:     Head: Atraumatic.  Eyes:     Extraocular Movements:  Extraocular movements intact.  Cardiovascular:     Pulses: Normal pulses.  Pulmonary:     Effort: Pulmonary effort is normal.  Musculoskeletal:     Comments: R shoulder pain with limited ROM. NVID  Neurological:     Mental Status: He is alert.  Psychiatric:        Mood and Affect: Mood normal.      Assessment/Plan R shoulder pain and weakness s/p failed rotator cuff repair. Plan R reverse TSA Risks / benefits of surgery discussed Consent on chart  NPO for OR Preop antibiotics   Isabella Stalling, MD 12/17/2020, 7:17 AM

## 2020-12-18 DIAGNOSIS — M75101 Unspecified rotator cuff tear or rupture of right shoulder, not specified as traumatic: Secondary | ICD-10-CM | POA: Diagnosis not present

## 2020-12-18 NOTE — Evaluation (Signed)
Occupational Therapy Evaluation Patient Details Name: Raymond Huffman MRN: [id number] DOB: 1956/02/27 Today's Date: 12/18/2020    History of Present Illness Patient is a 64 year old male admitted 12/23 for R reverse total shoulder arthroplasty. PMH includes tonsil cancer, R carpal tunnel   Clinical Impression   s/p shoulder replacement without functional use of right dominant upper extremity secondary to effects of surgery and interscalene block and shoulder precautions. Therapist provided education and instruction to patient in regards to exercises, precautions, positioning, donning upper extremity clothing and bathing while maintaining shoulder precautions, ice and edema management and donning/doffing sling. Patient verbalized understanding and demonstrated as needed. Patient needed assistance to donn shirt, and provided with instruction on compensatory strategies to perform ADLs. Patient to follow up with MD for further therapy needs.      Follow Up Recommendations  Follow surgeon's recommendation for DC plan and follow-up therapies    Equipment Recommendations  None recommended by OT       Precautions / Restrictions Precautions Precautions: Shoulder Type of Shoulder Precautions: AROM elbow wrist and hand ok, A/PROM shoulder NO Shoulder Interventions: Shoulder sling/immobilizer;Off for dressing/bathing/exercises Precaution Booklet Issued: Yes (comment) Required Braces or Orthoses: Sling Restrictions Weight Bearing Restrictions: Yes RUE Weight Bearing: Non weight bearing      Mobility Bed Mobility Overal bed mobility: Modified Independent             General bed mobility comments: cues for sequencing    Transfers Overall transfer level: Modified independent                    Balance Overall balance assessment: Modified Independent                                         ADL either performed or assessed with clinical judgement   ADL  Overall ADL's : Needs assistance/impaired Eating/Feeding: Modified independent   Grooming: Modified independent   Upper Body Bathing: Minimal assistance   Lower Body Bathing: Modified independent   Upper Body Dressing : Minimal assistance;Cueing for sequencing;Cueing for compensatory techniques;Cueing for UE precautions Upper Body Dressing Details (indicate cue type and reason): min A to thread R UE Lower Body Dressing: Modified independent;Sitting/lateral leans;Sit to/from stand   Toilet Transfer: Modified Dentist and Hygiene: Modified independent       Functional mobility during ADLs: Modified independent General ADL Comments: patient educated in compensatory strategies for ADLs in order to maintain shoulder precautions                  Pertinent Vitals/Pain Pain Assessment: Faces Faces Pain Scale: Hurts little more Pain Location: R shoulder Pain Descriptors / Indicators: Discomfort;Grimacing;Heaviness Pain Intervention(s): Premedicated before session     Hand Dominance Right   Extremity/Trunk Assessment Upper Extremity Assessment Upper Extremity Assessment: RUE deficits/detail RUE Deficits / Details: intact wrist and hand ROM   Lower Extremity Assessment Lower Extremity Assessment: Overall WFL for tasks assessed   Cervical / Trunk Assessment Cervical / Trunk Assessment: Normal   Communication Communication Communication: No difficulties   Cognition Arousal/Alertness: Awake/alert Behavior During Therapy: WFL for tasks assessed/performed Overall Cognitive Status: Within Functional Limits for tasks assessed  Exercises Exercises: Shoulder   Shoulder Instructions Shoulder Instructions Donning/doffing shirt without moving shoulder: Minimal assistance;Patient able to independently direct caregiver Method for sponge bathing under operated UE: Minimal assistance;Patient  able to independently direct caregiver Donning/doffing sling/immobilizer: Maximal assistance;Patient able to independently direct caregiver Correct positioning of sling/immobilizer: Modified independent;Patient able to independently direct caregiver Pendulum exercises (written home exercise program):  (N/A) ROM for elbow, wrist and digits of operated UE: Patient able to independently direct caregiver Sling wearing schedule (on at all times/off for ADL's): Patient able to independently direct caregiver;Independent Proper positioning of operated UE when showering: Patient able to independently direct caregiver Positioning of UE while sleeping: Patient able to independently direct caregiver    Home Living Family/patient expects to be discharged to:: Private residence Living Arrangements: Spouse/significant other Available Help at Discharge: Family Type of Home: House Home Access: Stairs to enter Technical brewer of Steps: 3 STE house and step into kitchen/living room   Home Layout: One level     Bathroom Shower/Tub: Occupational psychologist: [place]: Shower seat;Grab bars - tub/shower;Cane - single point;Walker - 2 wheels          Prior Functioning/Environment Level of Independence: Independent                 OT Problem List: Pain;Impaired UE functional use;Decreased knowledge of precautions         OT Goals(Current goals can be found in the care plan section) Acute Rehab OT Goals Patient Stated Goal: home OT Goal Formulation: All assessment and education complete, DC therapy                AM-PAC OT "6 Clicks" Daily Activity     Outcome Measure Help from another person eating meals?: None Help from another person taking care of personal grooming?: None Help from another person toileting, which includes using toliet, bedpan, or urinal?: None Help from another person bathing (including washing, rinsing, drying)?: None Help from  another person to put on and taking off regular upper body clothing?: A Little Help from another person to put on and taking off regular lower body clothing?: None 6 Click Score: 23   End of Session Equipment Utilized During Treatment: Other (comment) (sling) Nurse Communication: Mobility status  Activity Tolerance: Patient tolerated treatment well Patient left: in chair;with call bell/phone within reach  OT Visit Diagnosis: Pain Pain - Right/Left: Right Pain - part of body: Shoulder                Time: 1517-6160 OT Time Calculation (min): 40 min Charges:  OT General Charges $OT Visit: 1 Visit OT Evaluation $OT Eval Low Complexity: 1 Low OT Treatments $Self Care/Home Management : 23-37 mins  Delbert Phenix OT OT pager: [facility] 12/18/2020, 8:21 AM

## 2020-12-18 NOTE — Discharge Summary (Signed)
Patient ID: Raymond Huffman MRN: [id number] DOB/AGE: [birth year] 64 y.o.  Admit date: 12/17/2020 Discharge date: 12/18/2020  Admission Diagnoses:  Active Problems:   S/P reverse total shoulder arthroplasty, right   Discharge Diagnoses:  Same  Past Medical History:  Diagnosis Date   Anxiety    Arthritis    Cancer ([place])    tonsil cancer     Surgeries: Procedure(s): REVERSE SHOULDER ARTHROPLASTY on 12/17/2020   Consultants:   Discharged Condition: Improved  Hospital Course: Raymond Huffman is an 64 y.o. male who was admitted 12/17/2020 for operative treatment of right reverse tsa. Patient has severe unremitting pain that affects sleep, daily activities, and work/hobbies. After pre-op clearance the patient was taken to the operating room on 12/17/2020 and underwent  Procedure(s): [place].    Patient was given perioperative antibiotics:  Anti-infectives (From admission, onward)   Start     Dose/Rate Route Frequency Ordered Stop   12/17/20 1400  ceFAZolin (ANCEF) IVPB 2g/100 mL premix        2 g 200 mL/hr over 30 Minutes Intravenous Every 6 hours 12/17/20 1258 12/18/20 0132   12/17/20 0630  ceFAZolin (ANCEF) IVPB 2g/100 mL premix  Status:  Discontinued        2 g 200 mL/hr over 30 Minutes Intravenous On call to O.R. 12/17/20 0620 12/17/20 0625   12/17/20 0630  ceFAZolin (ANCEF) IVPB 2g/100 mL premix        2 g 200 mL/hr over 30 Minutes Intravenous  Once 12/17/20 0620 12/17/20 0745   12/17/20 0600  vancomycin (VANCOREADY) IVPB 1500 mg/300 mL        1,500 mg 150 mL/hr over 120 Minutes Intravenous  Once 12/16/20 0726 12/17/20 0858       Patient was given sequential compression devices, early ambulation, and asa to prevent DVT.  Patient benefited maximally from hospital stay and there were no complications.    Recent vital signs:  Patient Vitals for the past 24 hrs:  BP Temp Temp src Pulse Resp SpO2 Height Weight  12/18/20 0542 (!) 155/77 98.4 F (36.9  C) Oral 90 16 95 % -- --  12/18/20 0059 (!) 165/75 99 F (37.2 C) Oral 75 16 100 % -- --  12/17/20 2112 (!) 145/72 97.9 F (36.6 C) Oral 78 16 100 % -- --  12/17/20 2112 (!) 145/72 97.9 F (36.6 C) Oral 78 16 100 % -- --  12/17/20 1642 127/77 (!) 97.4 F (36.3 C) Oral 81 18 95 % -- --  12/17/20 1547 102/68 (!) 97.5 F (36.4 C) Oral 80 17 94 % -- --  12/17/20 1438 101/67 97.8 F (36.6 C) Oral 79 16 98 % -- --  12/17/20 1352 -- -- -- -- -- -- 6\' 4"  (1.93 m) 103.9 kg  12/17/20 1256 (!) 118/98 (!) 97.4 F (36.3 C) Oral 64 18 100 % -- --  12/17/20 1230 134/81 -- -- 63 11 100 % -- --  12/17/20 1200 (!) 155/84 -- -- 64 12 100 % -- --  12/17/20 1131 -- -- -- 64 13 97 % -- --  12/17/20 1130 (!) 143/77 -- -- -- -- -- -- --  12/17/20 1100 (!) 150/72 -- -- 72 15 99 % -- --  12/17/20 1045 (!) 147/74 -- -- 69 13 100 % -- --  12/17/20 1030 (!) 145/72 -- -- 72 16 97 % -- --  12/17/20 1015 127/70 -- -- 71 14 96 % -- --  12/17/20 1000 (!) 141/76 -- -- 67  12 94 % -- --  12/17/20 0945 119/73 -- -- 67 13 98 % -- --  12/17/20 0930 115/82 -- -- 72 17 97 % -- --     Recent laboratory studies: No results for input(s): WBC, HGB, HCT, PLT, NA, K, CL, CO2, BUN, CREATININE, GLUCOSE, INR, CALCIUM in the last 72 hours.  Invalid input(s): PT, 2   Discharge Medications:   Allergies as of 12/18/2020   No Known Allergies     Medication List    TAKE these medications   acetaminophen 650 MG CR tablet Commonly known as: TYLENOL Take 1,950 mg by mouth at bedtime as needed for pain.   acetaminophen 325 MG tablet Commonly known as: TYLENOL Take 975 mg by mouth every 6 (six) hours as needed for moderate pain.   albuterol 108 (90 Base) MCG/ACT inhaler Commonly known as: VENTOLIN HFA Inhale 1-2 puffs into the lungs every 6 (six) hours as needed for wheezing or shortness of breath.   gabapentin 300 MG capsule Commonly known as: NEURONTIN Take 300 mg by mouth 4 (four) times daily.    oxyCODONE-acetaminophen 10-325 MG tablet Commonly known as: PERCOCET Take 1 tablet by mouth every 6 (six) hours as needed for pain.       Diagnostic Studies: DG Chest 2 View  Result Date: 12/10/2020 CLINICAL DATA:  Shoulder surgery next week. EXAM: CHEST - 2 VIEW COMPARISON:  None. FINDINGS: The heart size and mediastinal contours are within normal limits. Both lungs are clear. No visible pleural effusions or pneumothorax. Mild biapical pleuroparenchymal scarring. No acute osseous abnormality. IMPRESSION: No active cardiopulmonary disease. Electronically Signed   By: Margaretha Sheffield MD   On: 12/10/2020 15:45   DG Shoulder Right Port  Result Date: 12/17/2020 CLINICAL DATA:  Status post shoulder arthroplasty EXAM: PORTABLE RIGHT SHOULDER COMPARISON:  None. FINDINGS: Total reverse right shoulder arthroplasty. No hardware failure or complication. No fracture or dislocation. Postsurgical changes in the surrounding soft tissues. IMPRESSION: Total reverse right shoulder arthroplasty. Electronically Signed   By: Kathreen Devoid   On: 12/17/2020 09:46    Disposition: Discharge disposition: 01-Home or Self Care       Discharge Instructions    Call MD / Call 911   Complete by: As directed    If you experience chest pain or shortness of breath, CALL 911 and be transported to the hospital emergency room.  If you develope a fever above 101 F, pus (white drainage) or increased drainage or redness at the wound, or calf pain, call your surgeon's office.   Constipation Prevention   Complete by: As directed    Drink plenty of fluids.  Prune juice may be helpful.  You may use a stool softener, such as Colace (over the counter) 100 mg twice a day.  Use MiraLax (over the counter) for constipation as needed.   Diet - low sodium heart healthy   Complete by: As directed    Increase activity slowly as tolerated   Complete by: As directed        Follow-up Information    Tania Ade, MD. Schedule  an appointment as soon as possible for a visit in 2 weeks.   Specialty: Orthopedic Surgery Contact information: [address] [place] [address] [phone]                Signed: Grier Mitts 12/18/2020, 9:23 AM

## 2020-12-18 NOTE — Plan of Care (Signed)
°  Problem: Health Behavior/Discharge Planning: Goal: Ability to manage health-related needs will improve Outcome: Adequate for Discharge   Problem: Clinical Measurements: Goal: Ability to maintain clinical measurements within normal limits will improve Outcome: Adequate for Discharge Goal: Will remain free from infection Outcome: Adequate for Discharge Goal: Diagnostic test results will improve Outcome: Adequate for Discharge Goal: Respiratory complications will improve Outcome: Adequate for Discharge   Problem: Activity: Goal: Risk for activity intolerance will decrease Outcome: Adequate for Discharge   Problem: Nutrition: Goal: Adequate nutrition will be maintained Outcome: Adequate for Discharge   Problem: Coping: Goal: Level of anxiety will decrease Outcome: Adequate for Discharge   Problem: Elimination: Goal: Will not experience complications related to bowel motility Outcome: Adequate for Discharge   Problem: Pain Managment: Goal: General experience of comfort will improve Outcome: Adequate for Discharge   Problem: Safety: Goal: Ability to remain free from injury will improve Outcome: Adequate for Discharge   Problem: Education: Goal: Knowledge of the prescribed therapeutic regimen will improve Outcome: Adequate for Discharge Goal: Understanding of activity limitations/precautions following surgery will improve Outcome: Adequate for Discharge   Problem: Activity: Goal: Ability to tolerate increased activity will improve Outcome: Adequate for Discharge   Problem: Pain Management: Goal: Pain level will decrease with appropriate interventions Outcome: Adequate for Discharge

## 2020-12-18 NOTE — Progress Notes (Signed)
   PATIENT ID: Raymond Huffman   1 Day Post-Op Procedure(s) (LRB): REVERSE SHOULDER ARTHROPLASTY (Right)  Subjective: reports right shoulder pain, feels like block didn't work well. Hopes to d/c today, tolerated OT well.   Objective:  Vitals:   12/18/20 0059 12/18/20 0542  BP: (!) 165/75 (!) 155/77  Pulse: 75 90  Resp: 16 16  Temp: 99 F (37.2 C) 98.4 F (36.9 C)  SpO2: 100% 95%     R shoulder dressing c/d/i Wiggles fingers, intact sensation distally  Labs:  No results for input(s): HGB in the last 72 hours.No results for input(s): WBC, RBC, HCT, PLT in the last 72 hours.No results for input(s): NA, K, CL, CO2, BUN, CREATININE, GLUCOSE, CALCIUM in the last 72 hours.  Assessment and Plan: 1 day s/p reverse TSA D/c home today, cleared by OT Resume home pain meds, no new scripts provided and patient aware Fu with Dr. Tamera Punt in 2 weeks   VTE proph: asa, scds

## 2020-12-22 ENCOUNTER — Encounter (HOSPITAL_COMMUNITY): Payer: Self-pay | Admitting: Orthopedic Surgery

## 2021-10-29 IMAGING — CR DG CHEST 2V
2 series · 2 of 2 positions shown · non-contrast
Comparison: None.

CLINICAL DATA: Shoulder surgery next week.

EXAM:
CHEST - 2 VIEW

[w chest pa]
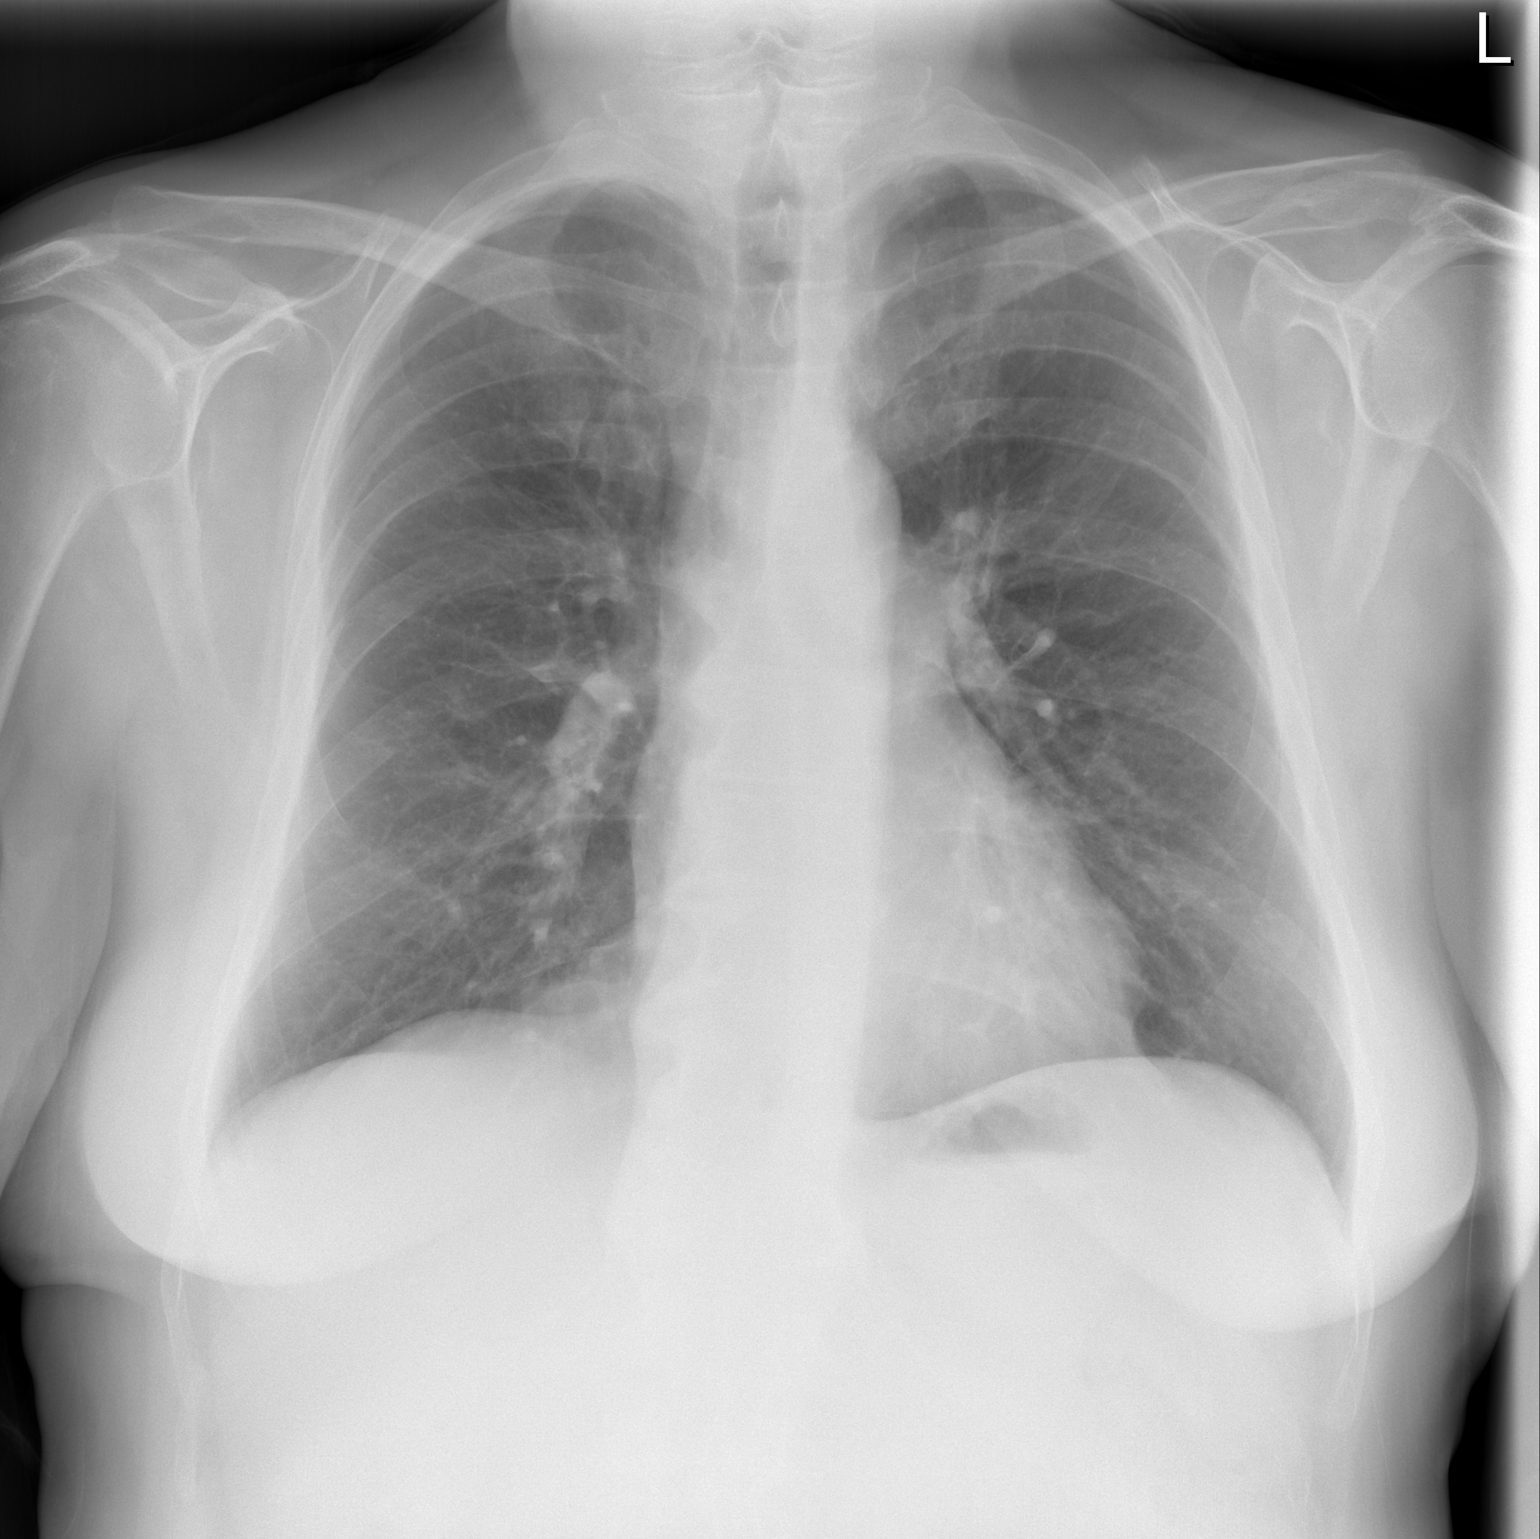

[w chest lat]
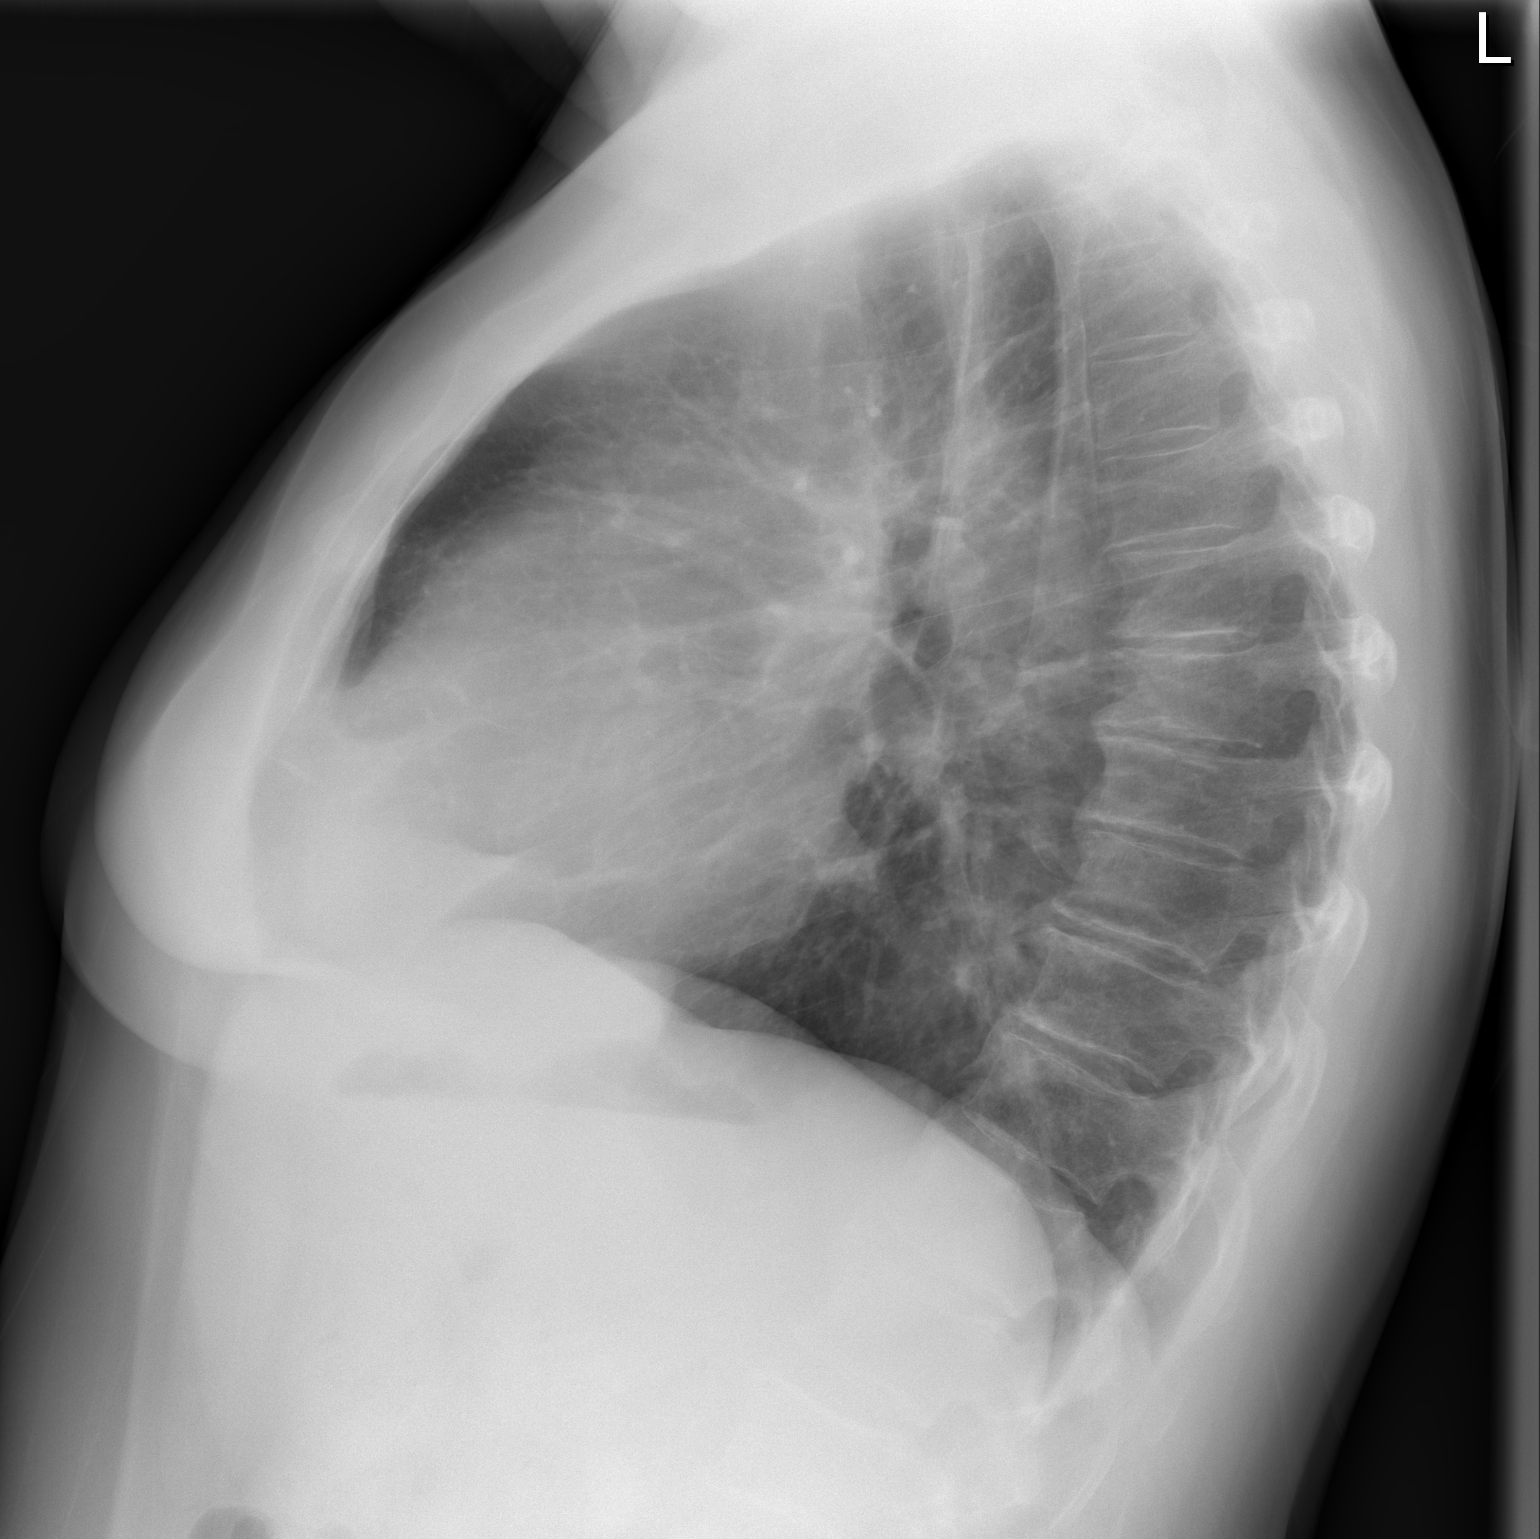

[2 of 2 positions shown; findings below may reference images not displayed]

FINDINGS: The heart size and mediastinal contours are within normal limits.
Both lungs are clear. No visible pleural effusions or pneumothorax.
Mild biapical pleuroparenchymal scarring. No acute osseous
abnormality.
IMPRESSION: No active cardiopulmonary disease.

## 2021-11-05 IMAGING — DX DG SHOULDER 2+V PORT*R*
1 series · 1 of 1 positions shown · non-contrast
Comparison: None.

CLINICAL DATA: Status post shoulder arthroplasty

EXAM:
PORTABLE RIGHT SHOULDER

[shoulder ap]
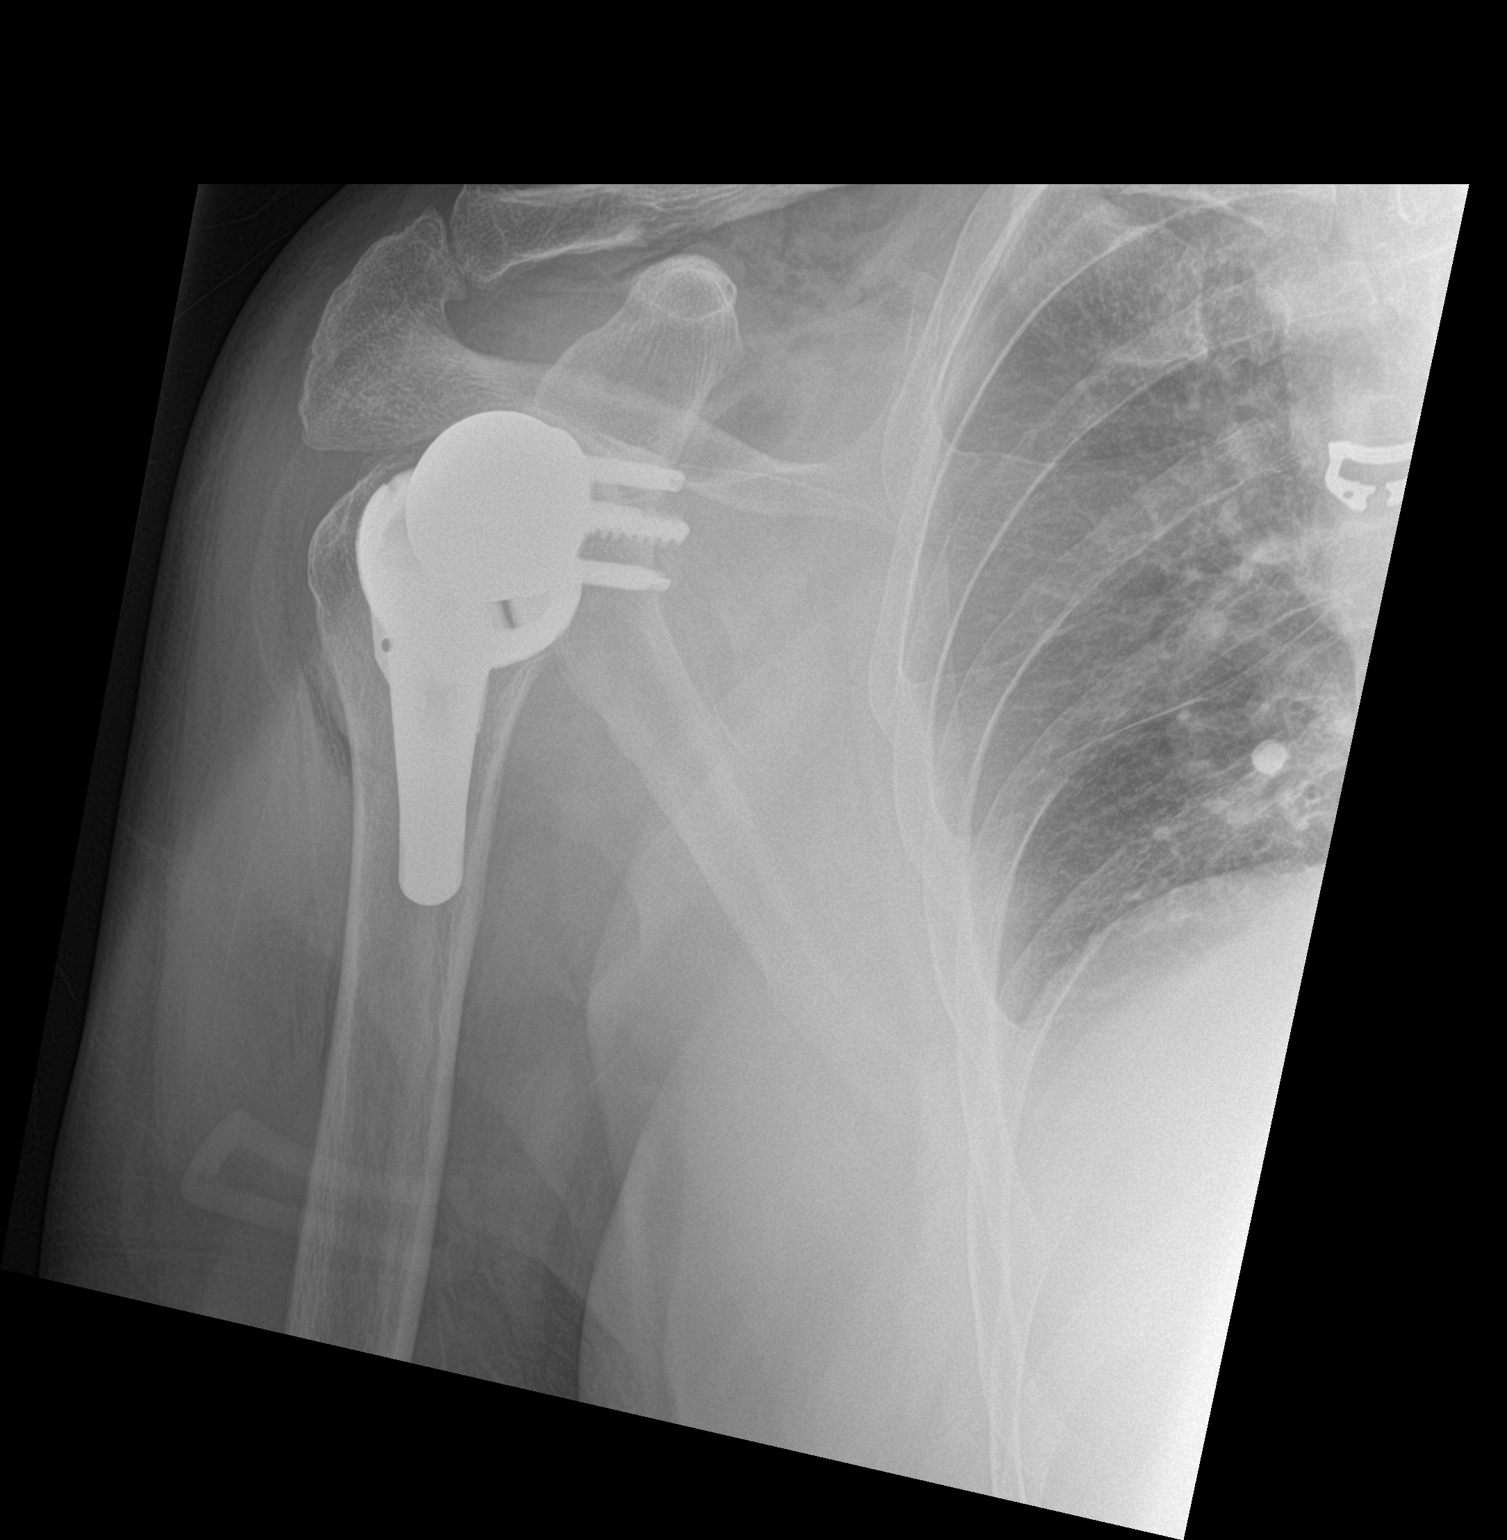

[1 of 1 positions shown; findings below may reference images not displayed]

FINDINGS: Total reverse right shoulder arthroplasty. No hardware failure or
complication. No fracture or dislocation. Postsurgical changes in
the surrounding soft tissues.
IMPRESSION: Total reverse right shoulder arthroplasty.
